# Patient Record
Sex: Female | Born: 1960 | Race: Black or African American | Hispanic: No | Smoking: Never smoker
Health system: Southern US, Community
[De-identification: ages and names within clinical notes are randomized; demographics above are authoritative.]

## PROBLEM LIST (undated history)

## (undated) DIAGNOSIS — E7800 Pure hypercholesterolemia, unspecified: Secondary | ICD-10-CM

## (undated) DIAGNOSIS — J309 Allergic rhinitis, unspecified: Secondary | ICD-10-CM

## (undated) DIAGNOSIS — E785 Hyperlipidemia, unspecified: Secondary | ICD-10-CM

## (undated) DIAGNOSIS — G4700 Insomnia, unspecified: Secondary | ICD-10-CM

## (undated) DIAGNOSIS — I10 Essential (primary) hypertension: Secondary | ICD-10-CM

## (undated) HISTORY — DX: Essential (primary) hypertension: I10

## (undated) HISTORY — DX: Pure hypercholesterolemia, unspecified: E78.00

## (undated) HISTORY — DX: Allergic rhinitis, unspecified: J30.9

## (undated) HISTORY — DX: Hyperlipidemia, unspecified: E78.5

## (undated) HISTORY — PX: OTHER SURGICAL HISTORY: SHX169

## (undated) HISTORY — DX: Insomnia, unspecified: G47.00

---

## 1998-06-05 ENCOUNTER — Other Ambulatory Visit: Admission: RE | Admit: 1998-06-05 | Discharge: 1998-06-05 | Payer: Self-pay | Admitting: Obstetrics and Gynecology

## 2001-04-17 ENCOUNTER — Other Ambulatory Visit: Admission: RE | Admit: 2001-04-17 | Discharge: 2001-04-17 | Payer: Self-pay | Admitting: *Deleted

## 2001-06-07 ENCOUNTER — Encounter (INDEPENDENT_AMBULATORY_CARE_PROVIDER_SITE_OTHER): Payer: Self-pay | Admitting: Specialist

## 2001-06-07 ENCOUNTER — Inpatient Hospital Stay (HOSPITAL_COMMUNITY): Admission: RE | Admit: 2001-06-07 | Discharge: 2001-06-10 | Payer: Self-pay | Admitting: *Deleted

## 2002-04-18 ENCOUNTER — Other Ambulatory Visit: Admission: RE | Admit: 2002-04-18 | Discharge: 2002-04-18 | Payer: Self-pay | Admitting: *Deleted

## 2008-03-16 ENCOUNTER — Inpatient Hospital Stay (HOSPITAL_COMMUNITY): Admission: RE | Admit: 2008-03-16 | Discharge: 2008-03-19 | Payer: Self-pay | Admitting: Internal Medicine

## 2008-03-16 ENCOUNTER — Ambulatory Visit: Payer: Self-pay | Admitting: Diagnostic Radiology

## 2008-03-16 ENCOUNTER — Encounter: Payer: Self-pay | Admitting: Emergency Medicine

## 2008-04-16 ENCOUNTER — Ambulatory Visit: Payer: Self-pay | Admitting: Internal Medicine

## 2008-06-14 ENCOUNTER — Ambulatory Visit (HOSPITAL_COMMUNITY): Admission: RE | Admit: 2008-06-14 | Discharge: 2008-06-14 | Payer: Self-pay | Admitting: Family Medicine

## 2010-02-06 IMAGING — CT CT CHEST WO/W CM
2 of 6 series · 15 of 36 positions shown, 18 images · IV contrast (agent unspecified)
Comparison: 03/16/2008

CLINICAL DATA: Pleurisy.  Possible sarcoidosis.

CT CHEST WITHOUT AND WITH CONTRAST
TECHNIQUE: Multidetector CT imaging of the chest was performed
following the standard protocol before and during bolus
administration of intravenous contrast.
Contrast: 100 ml [id number]

[Series 6: chest w/cm 5.0 st · axial · 0.64mm/px · z∈[+964,+1214]mm · 12 of 58 slices shown, 15 images]
[im 4/58  mediastinal]
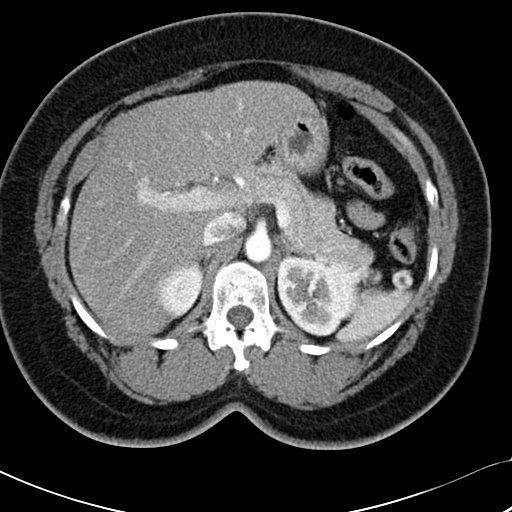
[im 4/58  lung]
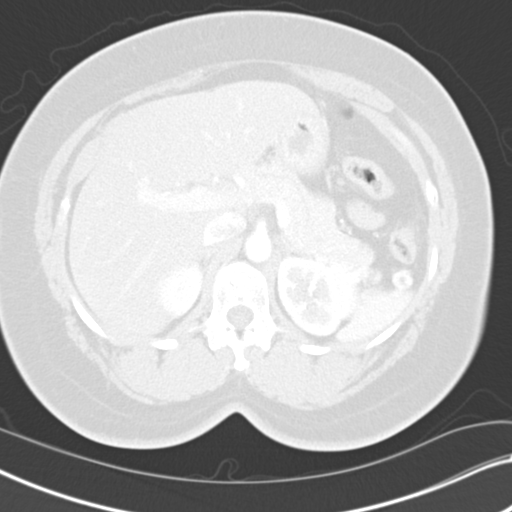
[im 8/58  lung]
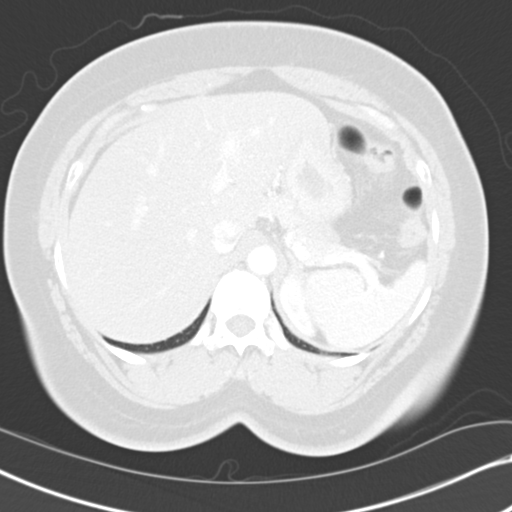
[im 12/58  lung]
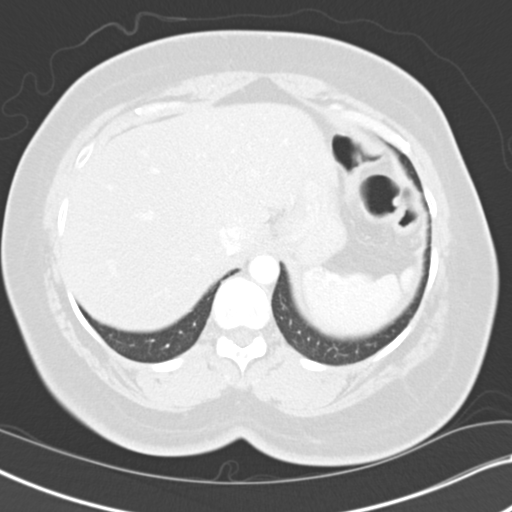
[im 20/58  lung]
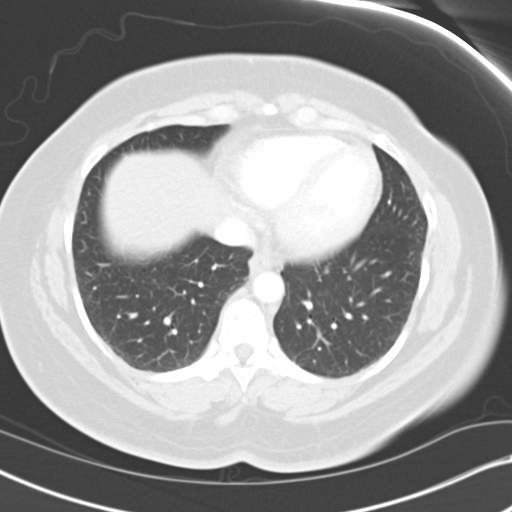
[im 23/58  mediastinal]
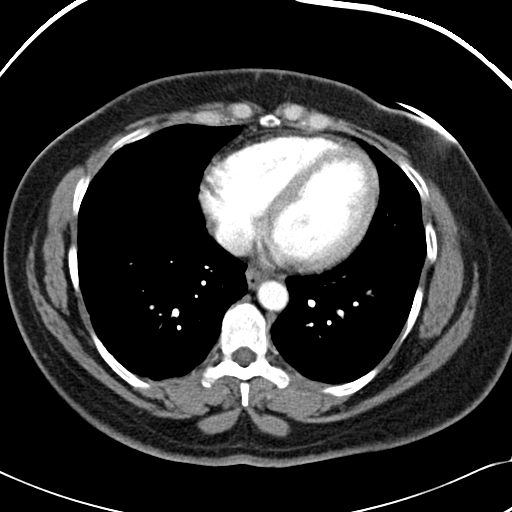
[im 23/58  lung]
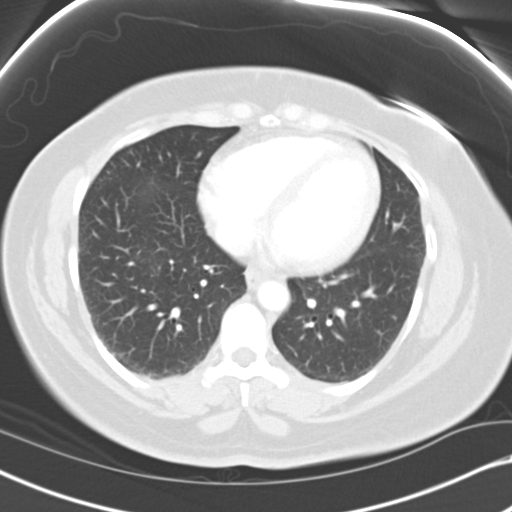
[im 27/58  lung]
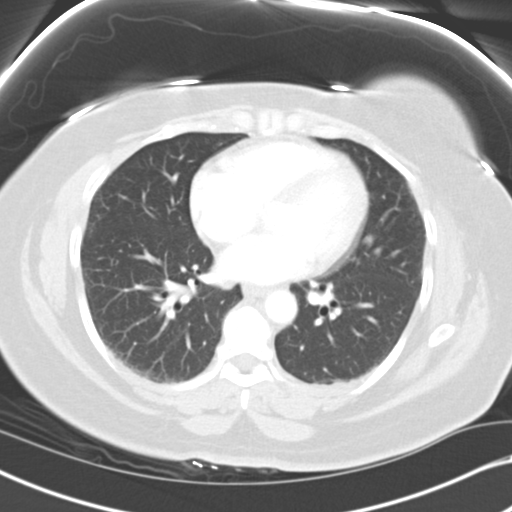
[im 31/58  lung]
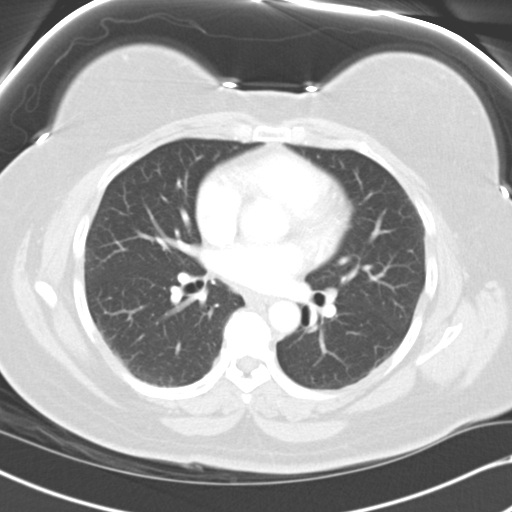
[im 35/58  lung]
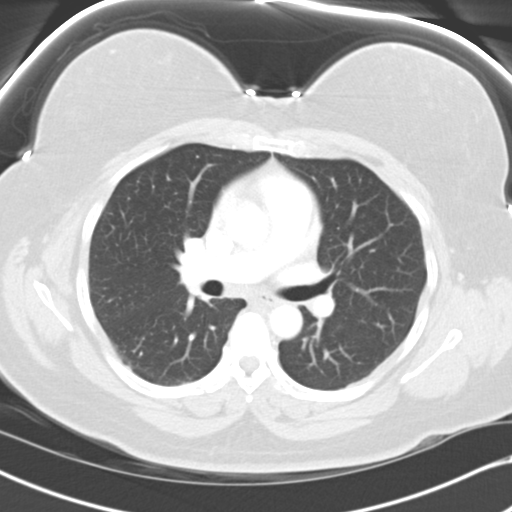
[im 39/58  mediastinal]
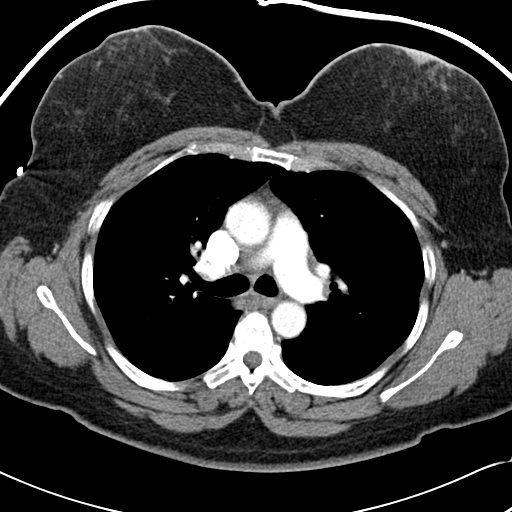
[im 39/58  lung]
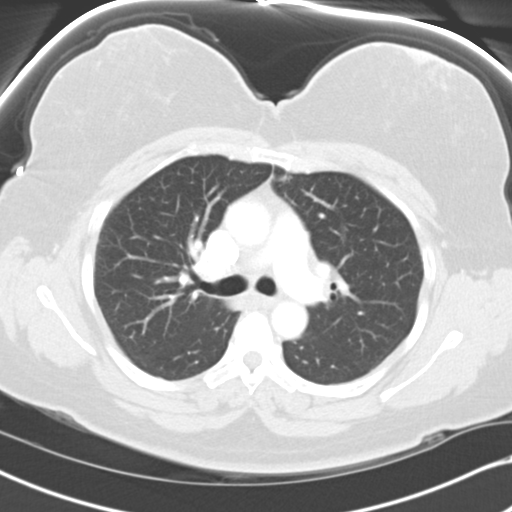
[im 46/58  lung]
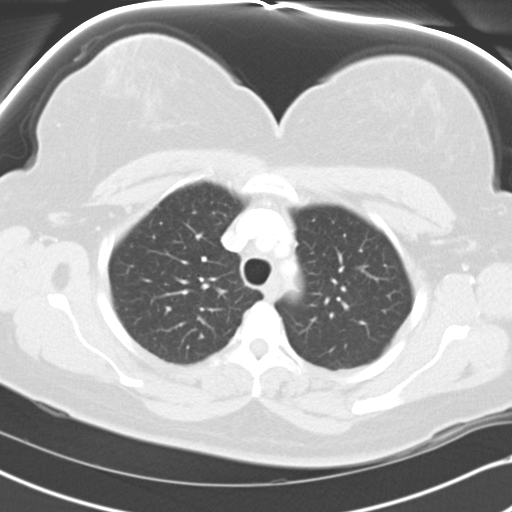
[im 50/58  lung]
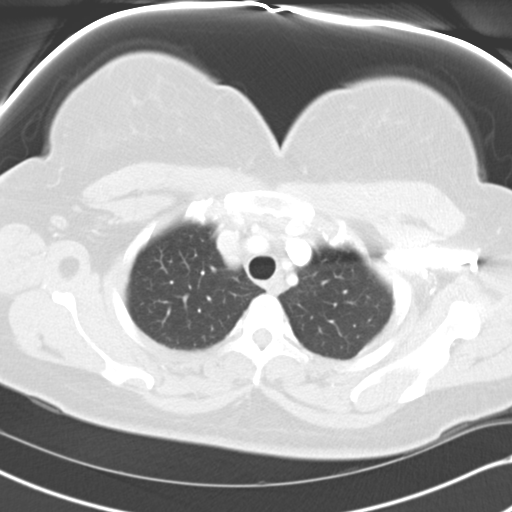
[im 54/58  lung]
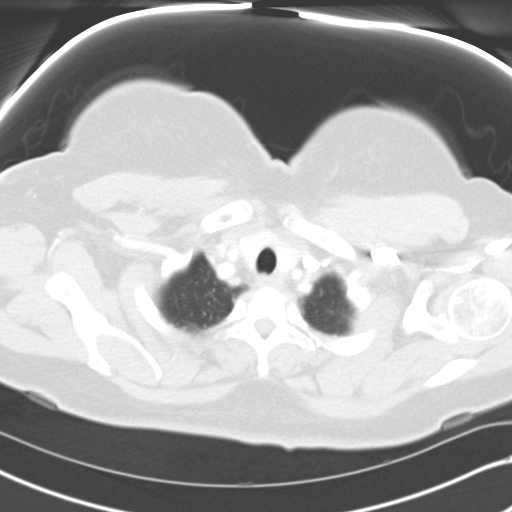

[Series 603: <mpr cor · coronal · 0.64mm/px · 3 of 91 slices shown]
[im 19/91  lung]
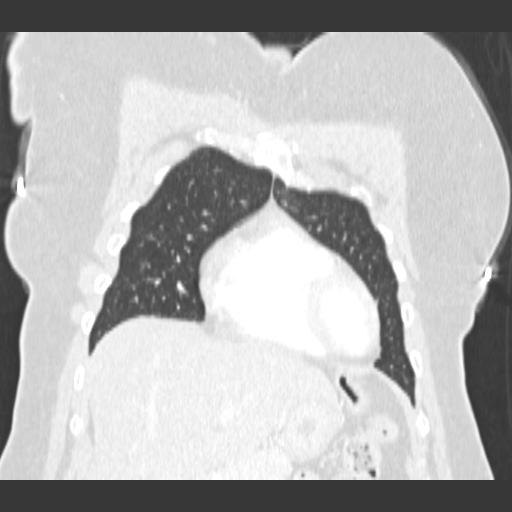
[im 37/91  lung]
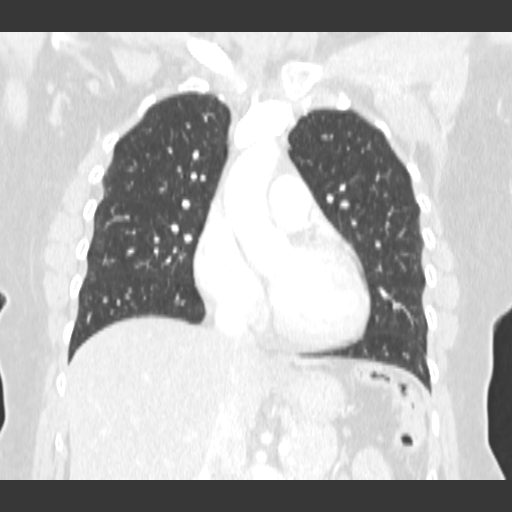
[im 55/91  lung]
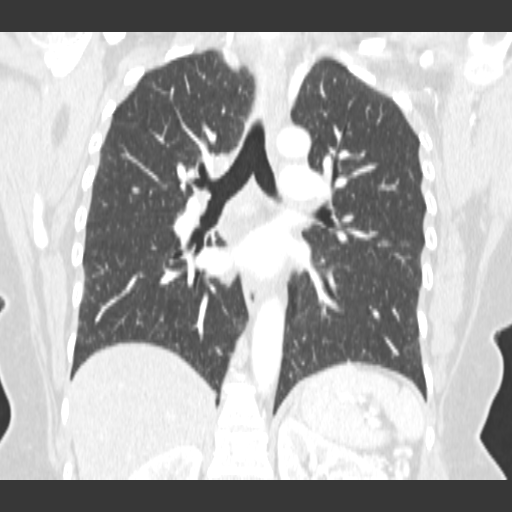

[15 of 36 positions shown; findings below may reference images not displayed]

FINDINGS: Previously seen hilar and mediastinal adenopathy has
resolved.

There is mild fatty infiltration of the visualized portion of the
liver.  Right subscapularis lipoma appears stable.  There is no
airway thickening or pleural nodularity.  However, very fine
subpleural reticular nodular interstitial prominence is again
noted.  This does not appear significantly progressive.

No pleural effusion noted.
IMPRESSION: 1.  Faint subpleural centrilobular nodularity raising the
possibility of hypersensitivity pneumonitis, respiratory
bronchiolitis (often seen in smokers), mild infectious airways
disease, or less likely vasculitis.
2.  The thoracic adenopathy has resolved and was likely reactive.

## 2010-04-02 ENCOUNTER — Other Ambulatory Visit: Payer: Self-pay | Admitting: Family Medicine

## 2010-04-02 ENCOUNTER — Other Ambulatory Visit (HOSPITAL_COMMUNITY)
Admission: RE | Admit: 2010-04-02 | Discharge: 2010-04-02 | Disposition: A | Discharge status: Home / Self Care | Payer: 59 | Source: Ambulatory Visit | Attending: Family Medicine | Admitting: Family Medicine

## 2010-04-02 DIAGNOSIS — Z0000 Encounter for general adult medical examination without abnormal findings: Secondary | ICD-10-CM | POA: Insufficient documentation

## 2010-06-15 LAB — COMPREHENSIVE METABOLIC PANEL
ALT: 38 U/L — ABNORMAL HIGH (ref 0–35)
AST: 40 U/L — ABNORMAL HIGH (ref 0–37)
Albumin: 4.5 g/dL (ref 3.5–5.2)
Alkaline Phosphatase: 55 U/L (ref 39–117)
Alkaline Phosphatase: 66 U/L (ref 39–117)
BUN: 10 mg/dL (ref 6–23)
BUN: 6 mg/dL (ref 6–23)
CO2: 28 mEq/L (ref 19–32)
CO2: 30 mEq/L (ref 19–32)
Calcium: 10.1 mg/dL (ref 8.4–10.5)
Calcium: 9.2 mg/dL (ref 8.4–10.5)
Chloride: 100 mEq/L (ref 96–112)
Creatinine, Ser: 0.7 mg/dL (ref 0.4–1.2)
GFR calc Af Amer: >60 mL/min (ref >60)
GFR calc non Af Amer: >60 mL/min (ref >60)
GFR calc non Af Amer: >60 mL/min (ref >60)
Glucose, Bld: 120 mg/dL — ABNORMAL HIGH (ref 70–99)
Glucose, Bld: 69 mg/dL — ABNORMAL LOW (ref 70–99)
Potassium: 3.6 mEq/L (ref 3.5–5.1)
Sodium: 139 mEq/L (ref 135–145)
Total Bilirubin: 0.6 mg/dL (ref 0.3–1.2)
Total Protein: 6.6 g/dL (ref 6.0–8.3)
Total Protein: 8.1 g/dL (ref 6.0–8.3)

## 2010-06-15 LAB — CBC
HCT: 39 % (ref 36.0–46.0)
HCT: 41.3 % (ref 36.0–46.0)
Hemoglobin: 13.3 g/dL (ref 12.0–15.0)
Hemoglobin: 14.1 g/dL (ref 12.0–15.0)
MCHC: 34 g/dL (ref 30.0–36.0)
MCHC: 34.2 g/dL (ref 30.0–36.0)
MCV: 90.9 fL (ref 78.0–100.0)
Platelets: 329 10*3/uL (ref 150–400)
RBC: 4.31 MIL/uL (ref 3.87–5.11)
RBC: 4.54 MIL/uL (ref 3.87–5.11)
RDW: 12 % (ref 11.5–15.5)
RDW: 12.7 % (ref 11.5–15.5)
WBC: 5.8 10*3/uL (ref 4.0–10.5)

## 2010-06-15 LAB — LIPASE, BLOOD: Lipase: 167 U/L (ref 23–300)

## 2010-06-15 LAB — URINALYSIS, ROUTINE W REFLEX MICROSCOPIC
Bilirubin Urine: NEGATIVE
Glucose, UA: NEGATIVE mg/dL
Hgb urine dipstick: NEGATIVE
Ketones, ur: NEGATIVE mg/dL
Nitrite: NEGATIVE
Protein, ur: NEGATIVE mg/dL
Specific Gravity, Urine: 1.021 (ref 1.005–1.030)
Urobilinogen, UA: 0.2 mg/dL (ref 0.0–1.0)
pH: 7 (ref 5.0–8.0)

## 2010-06-15 LAB — D-DIMER, QUANTITATIVE: D-Dimer, Quant: 0.72 ug/mL-FEU — ABNORMAL HIGH (ref 0.00–0.48)

## 2010-06-15 LAB — DIFFERENTIAL
Basophils Absolute: 0.2 10*3/uL — ABNORMAL HIGH (ref 0.0–0.1)
Basophils Relative: 4 % — ABNORMAL HIGH (ref 0–1)
Eosinophils Absolute: 0.2 10*3/uL (ref 0.0–0.7)
Eosinophils Relative: 3 % (ref 0–5)
Lymphocytes Relative: 38 % (ref 12–46)
Lymphs Abs: 2.2 10*3/uL (ref 0.7–4.0)
Monocytes Absolute: 0.6 10*3/uL (ref 0.1–1.0)
Monocytes Relative: 10 % (ref 3–12)
Neutro Abs: 2.6 10*3/uL (ref 1.7–7.7)
Neutrophils Relative %: 46 % (ref 43–77)

## 2010-06-15 LAB — PREGNANCY, URINE: Preg Test, Ur: NEGATIVE

## 2010-06-15 LAB — BASIC METABOLIC PANEL
BUN: 9 mg/dL (ref 6–23)
CO2: 27 mEq/L (ref 19–32)
Calcium: 9.1 mg/dL (ref 8.4–10.5)
Glucose, Bld: 85 mg/dL (ref 70–99)
Sodium: 141 mEq/L (ref 135–145)

## 2010-06-15 LAB — SEDIMENTATION RATE: Sed Rate: 25 mm/hr — ABNORMAL HIGH (ref 0–22)

## 2010-07-14 NOTE — H&P (Signed)
NAMEADDASYN, MCBREEN           ACCOUNT NO.:  [id number]   MEDICAL RECORD NO.:  [id number]          PATIENT TYPE:  INP   LOCATION:  3742                         FACILITY:  [facility]   PHYSICIAN:  Georgina Quint. Plotnikov, MDDATE OF BIRTH:  12/03/1960   DATE OF ADMISSION:  03/16/2008  DATE OF DISCHARGE:                              HISTORY & PHYSICAL   CHIEF COMPLAINT:  Right flank pain and shortness of breath.   HISTORY OF PRESENT ILLNESS:  The patient is a 50 year old female who  started to have sudden onset severe right flank pain on Saturday when  she was eating dinner.  The pain was unbearable for a while, she took  little Tylenol and it subsided.  Of note, she exercised prior doing  crunches and walking, but the pain does not feel to her like a pulled  muscle.  She was also sick with a cold for about a week prior.  The pain  has never subsided, it was much worse in the morning when she had to  hold her chest to get going.  Tylenol helped.  On Thursday, she traveled  to [place], on Wednesday came back.  While she was going to airport  with her carry-on, she developed severe pain in the right flank and  right chest at the bottom without radiation.  She was also short of  breath.  There was no nausea, vomiting, or diarrhea.  No lightheadedness  or syncope.  At the worse moment, the pain was 10/10.  She went to ER  today just to get checked due to the fact that she was very concerned  about the pain episode yesterday that was accompanied by dyspnea.  She  received medicine, now her pain is 0/10.  She denies leg pain, calf  swelling, and other symptoms.  No chills or fever.   PAST MEDICAL HISTORY:  Hypertension.   SURGICAL HISTORY:  Hysterectomy.   CURRENT MEDICATIONS:  Micardis daily unknown dose.   SOCIAL HISTORY:  She does not smoke or drink alcohol.  She works for  [employer].  She does not travel much.   ALLERGIES:  Morphine.   FAMILY HISTORY:  Father with coronary  artery disease.  No history of PE  in the family.  No clotting disorders.   REVIEW OF SYSTEMS:  As noted above, she had a cold prior.  The rest of  the 10-point review of systems is as above are negative.   PHYSICAL EXAMINATION:  VITAL SIGNS:  Temperature 98.4, blood pressure  121/93, heart rate 87, respirations 16, sats 98-99%.  GENERAL:  She is in no acute distress, slightly overweight.  HEENT:  Moist mucosa.  NECK:  Supple.  No thyromegaly or bruit.  LUNGS:  Clear.  No wheezes or rales.  HEART:  S1 and S2.  No gallop or no murmur.  CHEST:  Chest wall, nontender to palpation.  SKIN:  No rashes on the skin.  LS spine without deformities.  Palpation  of the chest wall, paraspinal muscles, and abdominal muscles are  unrevealing.  ABDOMEN:  Soft and nontender.  No organomegaly.  No mass felt.  EXTREMITIES:  Lower extremity is without edema.  Calves nontender.  Denna Haggard' sign is negative.  Pulse is symmetric and normal.  NEUROLOGIC:  Cranial nerves II through XII nonfocal.  Deep tendon  reflexes, muscle strength within normal limits.   LABORATORIES:  Urinalysis normal.  White count 5.8, hemoglobin 14.1, MCV  90.9, and platelets 329.  D-dimer 0.72, elevated.  Sodium 139, glucose  69, creatinine 0.7, potassium 3.6, and lipase 167.  Urine pregnancy test  is negative.  ALT 40, AST 40, and myoglobin, troponin, CK-MB all  negative.  Chest x-ray clear.  ABGs normal.  CT angiogram, no pulmonary  embolism with a limited evaluation of upper lobe; hilar adenopathy  present.  Fatty liver infiltration, otherwise unremarkable.   ASSESSMENT AND PLAN:  1. Right lower chest pain/right flank pain with an episode of dyspnea      on exertion.  The differential diagnosis is broad: her elevated D-      dimer  in view of her pain and shortness of breath makes one      suspicious for pulmonary embolism.  I doubt that a right upper lobe      pulmonary embolus would give this patient pain in the right flank.       Otherwise her CT was clear. Elev. D dimer is most likely a      nonspecific finding.  Most likely her pain is related to pleurisy      that developed following the upper respiratory tract infection.      Another possibility is a musculoskeletal strain (less likely).  We      will repeat D-dimer tomorrow.  She received dose of Lovenox in the      emergency room.  Of note, she is not short of breath now and her      pain is 0/10.  2. Adenopathy.  On chest x-ray, acute upper respiratory illness versus      sarcoid.  She will probably need to have  the CT scan repeated.  3. Hypertension.  On therapy.  4. Fatty liver infiltration, likely chronic.      Georgina Quint. Plotnikov, MD  Electronically Signed     AVP/MEDQ  D:  03/16/2008  T:  03/17/2008  Job:  [id number]   cc:   Dario Guardian, M.D.

## 2010-07-14 NOTE — Discharge Summary (Signed)
Gail Miles, Gail Miles NO.:  [id number]   MEDICAL RECORD NO.:  [id number]          PATIENT TYPE:  INP   LOCATION:  3742                         FACILITY:  [facility]   PHYSICIAN:  Ramiro Harvest, MD    DATE OF BIRTH:  05/02/1960   DATE OF ADMISSION:  03/16/2008  DATE OF DISCHARGE:  03/19/2008                               DISCHARGE SUMMARY   PRIMARY CARE PHYSICIAN:  Dr. Merri Miles of [facility].   DISCHARGE DIAGNOSES:  1. Pleurisy.  2. Hypertension.  3. Adenopathy.  4. Fatty liver infiltration per CT.  5. Allergies.   DISCHARGE MEDICATIONS:  1. Ibuprofen 600 mg p.o. t.i.d. x4 days.  2. Micardis hydrocortisone 20/12.5 mg p.o. daily.  3. Zyrtec 10 mg p.o. daily.  4. Phenergan 25 mg p.o. every 6 hours p.r.n. nausea.   DISPOSITION/FOLLOWUP:  The patient will be discharged home.  The patient  is to follow up with PCP in the next 1-2 weeks.  On follow up the  patient will need a basic metabolic profile checked to follow up on  electrolytes and renal function.  The patient will also need a repeat CT  in 3 months to follow up on this mediastinal and bilateral hilar  adenopathy.  If not improved, may consider workup for possible cycle  doses versus infectious etiology.   CONSULTATIONS DONE:  None.   PROCEDURES PERFORMED:  1. A CT angiogram of the chest was done on March 16, 2008, which      showed no evidence of pulmonary embolism, limited evaluation of the      upper lobes without evidence of low lung embolism, mediastinal and      bilateral hilar adenopathy.  This is nonspecific, could be      infectious or related to sarcoidosis.  Lymphoproliferative      processes felt much less likely, correlate with any signs, symptoms      of sarcoidosis.  Consider followup chest CT in approximately 3      months.  Subtle subpleural linear opacities in both lungs most      likely related to atelectasis, mild interstitial lung disease felt      less likely, fatty  infiltration of the liver.  2. Chest x-ray was obtained March 16, 2008, that showed no active      lung disease.  No scar or atelectasis the posterior right lobe.   BRIEF ADMISSION HISTORY AND PHYSICAL:  Ms. Gail Miles is a 50-  year-old female who started to have sudden onset severe right flank pain  on Saturday prior to admission when she was eating dinner.  This pain  was unbearable for a while.  She took a little Tylenol and it subsided.  Of note, she exercised prior to doing this with crutches and walking but  the pain did not feel to her like a pulled muscle.  The patient was also  sick with a cold for about a week prior to that.  The pain had never  subsided.  It was much worse in the morning when she had to hold her  chest to get going.  Tylenol  helped some.  On Thursday, she had traveled  to [place].  On Wednesday and had come back.  While she was going to  the airport with a carry-on she developed severe pain in her right flank  and right chest at the bottom without radiation.  She was also short of  breath.  There was no nausea, vomiting or diarrhea.  No lightheadedness  or syncope.  At the worst moment the pain was a 10 out of a 10.  The  patient presented to the ED on the day of admission just to get checked  due to the fact that she was very concerned about the pain episode the  day prior to admission which was also accompanied with dyspnea.  She  received medicine and the pain was now 0 out of 10.  She denied any neck  pain.  No calf swelling, no other symptoms.  No chills or fever.   PHYSICAL EXAM/ADMITTING PHYSICIAN:  Temperature 98.4.  Blood pressure  121/93.  Pulse of 87.  Respirations 60.  Satting 98%-99%.  GENERAL:  The patient no acute distress, slightly overweight.  HEENT:  Normocephalic, atraumatic.  Pupils equal, round and reactive to  light.  Extraocular movements intact.  Oropharynx is clear.  No lesions.  No exudates.  NECK:  Supple with no  lymphadenopathy.  Moist mucous membranes.  LUNGS:  Clear to auscultation bilaterally.  No wheezes, no crackles, no  rales.  CARDIOVASCULAR:  Regular rate rhythm.  No murmurs, rubs or gallops.  Chest wall was nontender to palpation.  SKIN:  No rashes on the skin at the L-spine without deformities.  Palpation of the chest wall paraspinal muscles and abdominal muscles  were unrevealing.  ABDOMEN:  Soft, nontender, no masses felt.  No organomegaly.  Positive  bowel sounds.  EXTREMITIES:  No clubbing, cyanosis or edema.  Calves were nontender.  Negative Homans' sign.  Symmetric pulses.  NEUROLOGICAL:  Cranial nerves II-XII were grossly intact.  No focal  deficits.  Deep tendon reflexes muscle strength within normal limits.   ADMISSION LABS:  UA was normal.  White count of 5.8, hemoglobin 14.1,  MCV of 90.9, platelets of 329, D-dimer of 0.72, sodium 139, glucose 69,  creatinine 0.7, potassium 3.6, lipase 167.  Urine pregnancy negative.  ALT 40, AST 40, myoglobin, troponin CK-MB of all negative.  Chest x-ray  was clear.  ABGs were normal.  CT angio showed no pulmonary embolism  with a limited evaluation of the upper lobe, hilar adenopathy was  present.  Fatty liver infiltration otherwise unremarkable.   HOSPITAL COURSE:  1. Pleurisy:  It was felt that the patient's the right lower chest      pain/right flank pain with an episode of dyspnea on exertion was      likely secondary to pleurisy.  A repeat  therapy D-dimer was      obtained which came back negative and was within normal limits.  A      chest CT as stated above did not show any evidence of pulmonary      embolism in the lower lobes.  A sed rate was obtained which came      back at 25.  Point of care cardiac markers were obtained which were      negative.  Lipase level was also obtained which was 167 within      normal limits.  The repeat D-dimer came back at 0.47.  The patient      was placed  on Motrin 600 mg 3 times daily with  improvement in her      symptoms.  The patient remained stable.  On hospital day #2 the      patient developed some nausea and emesis which was controlled      symptomatically.  The patient's nausea and emesis was controlled      symptomatically.  By day of discharge the patient's nausea and      emesis had resolved.  The patient's pleurisy had improved and the      patient will be discharged home in stable and improved condition.      The patient will be discharged home on ibuprofen 600 mg t.i.d. for      4 more days and to follow up with her PCP in the next 1-2 weeks.      The rest of the patient's chronic medical issues were stable      throughout the hospitalization and the patient will be discharged      in stable and improved condition.   Vital signs on day of discharge:  Temperature 98.2, blood pressure  119/73, pulse of 77, respiratory rate 18, satting 98% on room air.   DISCHARGE LABS:  Sodium 141, potassium 3.9, chloride 106, bicarb 27, BUN  9, creatinine 0.6, glucose of 85 and a calcium of 9.1.  It was a  pleasure taking care of Ms. Darcey Nora.      Ramiro Harvest, MD  Electronically Signed     DT/MEDQ  D:  03/19/2008  T:  03/19/2008  Job:  [id number]   cc:   Dario Guardian, M.D.

## 2010-07-17 NOTE — Op Note (Signed)
[facility]  Patient:    Gail Miles, Gail Miles Visit Number: [id number] MRN: [id number]          Service Type: GYN Location: 4W [id number] Attending Physician:  Marin Comment Dictated by:   Pershing Cox, M.D. Proc. Date: 06/07/01 Admit Date:  06/07/2001   CC:         Gail Miles. Gail Miles, M.D.   Operative Report  PREOPERATIVE DIAGNOSES:  1. Myomatous uterus.  2. Menorrhagia.  3. Anemia.  POSTOPERATIVE DIAGNOSES:  1. Myomatous uterus.  2. Menorrhagia.  3. Anemia.  PROCEDURE:  Exploratory laparotomy and supracervical hysterectomy.  ANESTHESIA:  General endotracheal.  SURGEON:  Pershing Cox, M.D.  ASSISTANT:  Gail Miles. Gail Miles, M.D.  INDICATIONS FOR PROCEDURE:  The patient is a 50 year old, gravida 3, para 2, African-American female status post tubal ligation. She has been having very heavy monthly menses and has six days which have produced extraordinarily heavy bleeding. When she was seen in my office approximately four months ago, she was found to have a very large uterus deviated to the left consistent with myomas. Hemoglobin at the time of her initial presentation was 6.3. She was subsequently treated with iron and Lupron Depot and is brought to the operating room today for hysterectomy.  OPERATIVE FINDINGS:  The uterus was approximately 14-16 weeks in size. There were three very large uterine myomas that were rising from the uterine fundus. One of them significantly distorted the blood supply on the patients right and this was approximately 10 cm in size. The cervix was short, there was evidence of tubal ligation in the past and two small peritubal cysts. The appendix was normal in appearance. There was no ascites and no evidence of implants.  DESCRIPTION OF PROCEDURE:  Gail Miles was brought to the operating room with an IV in place. In the holding area, she received a gram of Ancef and PAS stockings were placed on the  lower extremities. These were thigh high stockings. On the operating room table, general endotracheal anesthesia was administered without difficulty. She was placed in the frog leg position and prepped with Hibiclens to cleanse the anterior abdominal wall, perineum and vagina. A Foley catheter was sterilely inserted into the bladder. On exam under anesthesia, I felt that the fibroids were diminished enough that we would be able to do a transverse incision. Measuring above the symphysis approximately two finger breadths, a 0.25% Marcaine was injected for a length of approximately 20 cm in each direction. The scalpel was then used to incise the skin, cautery was used to take [place] down through the subcutaneous tissues until the fascia was encountered. The fascia was scored in the midline and then the incision was extended in smile-like fashion superiorly on each side. The fascia was separated from the underlying rectus muscles by blunt and sharp dissection. Cautery was used where it was most adherent. The rectus muscles were separated in the midline. The fascia was tinted and opened atraumatically without injury to underlying structures or the bladder. We separated the lower fascial edges from one another with sharp dissection. In palpating the uterus, it was clear that there were no lower uterine segment myomas. The fascial and skin incisions were extended approximately 2 more centimeters on either side to prepare for removal of the uterus.  The Balfour self retaining retractor was used to retract the abdominal wall using towels to cover the edges of the skin and elevate the retractor off the underlying vessels. The round ligaments were identified  and suture ligated and held for later in the procedure. The utero-ovarian ligament and mesosalpinx of the fallopian tube were identified and then pierced through the broad ligament and separated from the uterus. Each of these pedicles was clamped,  sutured, and free tie ligated. A cork screw was placed into the central fundal uterine myoma and used to elevate the fundus. We were then able to lift the uterus out of the cavity. It was still impossible to see the uterine arteries and therefore a myomectomy was performed of the largest myoma on the right. Dilute vasopressin was injected into the surface of the myoma. Cautery was used to incise, the myoma was separated from the overlying serosa by blunt and sharp dissection and the myoma was grasped with the towel clip and removed totally from the uterine specimen. Once it had been removed, it was possible to see the uterine arteries on each side.  The peritoneum overlying the lower uterine segment was incised and the bladder was taken down off the lower uterine segment. The uterine arteries were clamped on each side of the specimen, cut, suture and free tie ligated. On the patients left, it was necessary to do this in two steps because the paracervical tissue was quite wide. In each situation, we took care to visualize and avoid the ureter. The second clamps were placed along the uterine arteries so that the uterine artery would be doubly ligated. The malleable retractor was then placed posterior to the uterus and a knife was used to excise the uterine specimen. Approximately 2 cm of cervix were left. The cervix was then oversewn with #0 Vicryl sutures while being held with clamps. On the patients left, there was bleeding from the uterosacral ligaments. This was suture ligated but was not cut. Several other sutures were placed just to the lateral margins of the cervix to bring the serosal edges together and to contain small bleeders. We looked at the bladder peritoneum and inspected it for bleeding. There was none. The pelvic cavity was irrigated and made hemostatic were there were small bleeders. There were several small bleeders on the rectus fascia and these were cauterized. One of  them on the  patients right was suture ligated. The fallopian tubes and ovaries were visualized and sutured to the round ligaments. On each of the fallopian tubes, there were small peritubal cysts which were excised but not sent for specimens. Once we had inspected the pelvis and found that there was no bleeding, we then layered the rectosigmoid into the cul-de-sac, small bowel was drawn over the top and adhesions of the omentum to the anterior abdominal wall were taken down. The fascia was closed with running #0 monocryl suture starting on each edge and burying the stitch in the middle. The subcutaneous tissues were made hemostatic, skin edges were brought together with 4-0 monocryl using a running subcuticular stitch. Benzoin and Steri-Strips were applied.  Estimated blood loss 200 cc. Urine output 100 cc. Fluids 2700 cc. Complications none. Specimens, uterine fundus with enucleated myoma. Dictated by:   Pershing Cox, M.D. Attending Physician:  Marin Comment DD:  06/07/01 TD:  06/08/01 Job: [id number]

## 2010-07-17 NOTE — Discharge Summary (Signed)
[facility]  Patient:    Gail Miles, Gail Miles Visit Number: [id number] MRN: [id number]          Service Type: Attending:  Pershing Cox, M.D. Dictated by:   Pershing Cox, M.D. Adm. Date:  06/07/01 Disc. Date: 06/10/01   CC:         Dario Guardian, M.D.   Discharge Summary  ADMITTING DIAGNOSES: 1. Myomatous uterus. 2. Menorrhagia. 3. Anemia.  DISCHARGE DIAGNOSES: 1. Menorrhagia. 2. Myomatous uterus. 3. Anemia. 4. Low-grade temperature.  HISTORY OF PRESENT ILLNESS:  For details of the patients History and Physical, please see the note dated June 07, 2001.  The patient is a 50 year old, African-American female who has been known to have a myomatous uterus for several years.  Her menstrual periods have been very heavy and when she presented, her hemoglobin was 6.3.  She was treated with Depo-Lupron. After resolving her hemoglobin and anemia, she was admitted for hysterectomy. On the day of admission, she was taken to the operating room where supracervical hysterectomy was performed without difficulty.  HOSPITAL COURSE:  Since admission, the patient has had improvement in symptomatology and has been able to resume most of her normal functions.  She was treated with PCA morphine, but had significant nausea on this medication and was switched to IV Dilaudid by PCA.  This was discontinued on postoperative day #2 and she has been on pain medications since that time. She began a regular diet yesterday and has tolerated it well.  She ate a full diet today without any nausea.  Over the last 24 hours, she has developed low-grade temperature with the highest value being 101.5.  At this time, her temperature is 99.3.  Her examination shows clear lungs with good use of the spirometer.  There is no CVA tenderness.  Her abdomen is soft and her wound is intact.  There are Steri-Strips covering the subcuticular closure.  Her calves are nontender.   Her white blood cell count is 5.1, hemoglobin stable at 10.8 and platelets are 282.  The patient has been on hydrochlorothiazide for hypertension.  In the hospital course, this has been held because her hypertension has been absent and in fact, she has been significantly hypotensive.  She is given discharge instructions today to hold her hydrochlorothiazide until she sees Dr. Merri Brunette.  The patients low-grade temperature is uncertain etiology.  She is given prescription for Darvocet to be used at home for pain.  She will take her temperature every four hours and certainly take it before each dose of Darvocet.  She will let us know if her temperature stays over 101.5 x2 or if she has any chills.  Of note, her urine is concentrated.  Her urinalysis is negative.  She will be pushing p.o. fluids.  She understands that her potassium is 3.4.  She drinks a lot of orange juice and takes a daily banana and she will resume this at home.  The patient pathology is pending at the time of this dictation.  She had several fibroids and the uterine fundus was removed, but there is no evidence of this information being available at the time of discharge. Dictated by:   Pershing Cox, M.D. Attending:  Pershing Cox, M.D. DD:  06/10/01 TD:  06/12/01 Job: [id number]

## 2013-03-12 ENCOUNTER — Other Ambulatory Visit (HOSPITAL_COMMUNITY)
Admission: RE | Admit: 2013-03-12 | Discharge: 2013-03-12 | Disposition: A | Discharge status: Home / Self Care | Payer: 59 | Source: Ambulatory Visit | Attending: Family Medicine | Admitting: Family Medicine

## 2013-03-12 ENCOUNTER — Other Ambulatory Visit: Payer: Self-pay | Admitting: Family Medicine

## 2013-03-12 DIAGNOSIS — Z124 Encounter for screening for malignant neoplasm of cervix: Secondary | ICD-10-CM | POA: Insufficient documentation

## 2013-03-27 ENCOUNTER — Encounter: Payer: Self-pay | Admitting: Cardiology

## 2013-03-27 ENCOUNTER — Encounter: Payer: Self-pay | Admitting: *Deleted

## 2013-03-27 DIAGNOSIS — J309 Allergic rhinitis, unspecified: Secondary | ICD-10-CM | POA: Insufficient documentation

## 2013-03-27 DIAGNOSIS — I10 Essential (primary) hypertension: Secondary | ICD-10-CM | POA: Insufficient documentation

## 2013-03-27 DIAGNOSIS — E785 Hyperlipidemia, unspecified: Secondary | ICD-10-CM | POA: Insufficient documentation

## 2013-03-27 DIAGNOSIS — E7800 Pure hypercholesterolemia, unspecified: Secondary | ICD-10-CM | POA: Insufficient documentation

## 2013-03-27 DIAGNOSIS — G4700 Insomnia, unspecified: Secondary | ICD-10-CM | POA: Insufficient documentation

## 2013-04-06 ENCOUNTER — Encounter: Payer: Self-pay | Admitting: Cardiology

## 2013-04-06 ENCOUNTER — Ambulatory Visit (INDEPENDENT_AMBULATORY_CARE_PROVIDER_SITE_OTHER): Payer: 59 | Admitting: Cardiology

## 2013-04-06 VITALS — BP 130/80 | HR 83 | Ht 63.0 in | Wt 178.0 lb

## 2013-04-06 DIAGNOSIS — E785 Hyperlipidemia, unspecified: Secondary | ICD-10-CM

## 2013-04-06 DIAGNOSIS — I10 Essential (primary) hypertension: Secondary | ICD-10-CM

## 2013-04-06 DIAGNOSIS — R079 Chest pain, unspecified: Secondary | ICD-10-CM

## 2013-04-06 DIAGNOSIS — R9431 Abnormal electrocardiogram [ECG] [EKG]: Secondary | ICD-10-CM

## 2013-04-06 DIAGNOSIS — Z8249 Family history of ischemic heart disease and other diseases of the circulatory system: Secondary | ICD-10-CM

## 2013-04-06 NOTE — Progress Notes (Signed)
[address], [address] [place], [place]  [address] Phone: [phone] Fax:  [phone]  Date:  04/06/2013   ID:  Gail Miles, DOB 12-Jan-1961, MRN [id number]  PCP:  Allean Found, MD   History of Present Illness: Gail Miles is a 53 y.o. female here with hypertension, hyperlipidemia Candace Smith secondary to abnormal EKG performed at her clinic. She likes to one half marathons and there is heart disease and her family. Hemoglobin 13.6, creatinine 0.75, potassium 3.4, LDL cholesterol 98 on 20 mg of Crestor a day. HDL is 51. No chest pain, no SOB, no syncope, no palpitations. Father had CABG at age 25. HTN since 25 years old.  She is concerned because she does run half marathons. EKG today shows septal infarct pattern, nonspecific ST flattening in inferior lead aVF.   Wt Readings from Last 3 Encounters:  04/06/13 178 lb (80.74 kg)     Past Medical History  Diagnosis Date  . Hypercholesteremia   . HTN (hypertension)   . Allergic rhinitis   . Insomnia   . Hyperlipoproteinemia     Past Surgical History  Procedure Laterality Date  . Pleurisy age 34      Current Outpatient Prescriptions  Medication Sig Dispense Refill  . Cetirizine HCl (ZYRTEC PO) Take 1 tablet by mouth daily.      . Multiple Vitamin (MULTIVITAMIN) capsule Take 2 tabs daily      . Omega-3 Fatty Acids (OMEGA-3 FISH OIL PO) Take by mouth. Take 1 tab daily      . rosuvastatin (CRESTOR) 20 MG tablet Take 20 mg by mouth daily.      Marland Kitchen telmisartan-hydrochlorothiazide (MICARDIS HCT) 80-25 MG per tablet Take 1 tablet by mouth daily.       No current facility-administered medications for this visit.    Allergies:    Allergies  Allergen Reactions  . Morphine And Related     Social History:  The patient  reports that she has never smoked. She does not have any smokeless tobacco history on file.   Family History  Problem Relation Age of Onset  . CAD Father   . Heart attack Father     . Hyperlipidemia Father   . Hypertension Mother     ROS:  Please see the history of present illness.    denies any syncope, bleeding, orthopnea, PND, chest pain, shortness of breath, palpitations, rash.   All other systems reviewed and negative.   PHYSICAL EXAM: VS:  BP 130/80  Pulse 83  Ht 5\' 3"  (1.6 m)  Wt 178 lb (80.74 kg)  BMI 31.54 kg/m2 Well nourished, well developed, in no acute distress HEENT: normal, [place]/AT, EOMI Neck: no JVD, normal carotid upstroke, no bruit Cardiac:  normal S1, S2; RRR; no murmur Lungs:  clear to auscultation bilaterally, no wheezing, rhonchi or rales Abd: soft, nontender, no hepatomegaly, no bruits Ext: no edema, 2+ distal pulses Skin: warm and dry GU: deferred Neuro: no focal abnormalities noted, AAO x 3  EKG: 04/06/13 - EKG today shows septal infarct pattern, nonspecific ST flattening in inferior lead aVF.      ASSESSMENT AND PLAN:  1. Abnormal EKG-septal infarct pattern possibly normal variant for her however given her strenuous exercise, familial hyperlipidemia, hypertension, family history of CAD, I would like to proceed with exercise treadmill test to ensure that there no gross evidence or gross signs of ischemia.  2. Family history of CAD-excellent primary prevention. Continue. 3. Hypertension-excellent control on current  medications. She has been hypertensive since age 53. 4. Hyperlipidemia-likely familial hyperlipidemia. Currently on high-dose Crestor. Excellent LDL less than 100 recently.  Signed, Donato SchultzMark Patrice Moates, MD [facility]  04/06/2013 8:41 AM

## 2013-04-06 NOTE — Patient Instructions (Signed)
Your physician recommends that you continue on your current medications as directed. Please refer to the Current Medication list given to you today.  Your physician has requested that you have an exercise tolerance test. For further information please visit [email or web address]. Please also follow instruction sheet, as given.  Follow up as needed  

## 2013-04-10 ENCOUNTER — Encounter: Payer: 59 | Admitting: Nurse Practitioner

## 2013-04-16 ENCOUNTER — Encounter: Payer: 59 | Admitting: Physician Assistant

## 2015-05-12 ENCOUNTER — Ambulatory Visit: Payer: Self-pay | Admitting: Podiatry

## 2016-08-09 DIAGNOSIS — I10 Essential (primary) hypertension: Secondary | ICD-10-CM | POA: Diagnosis not present

## 2016-08-09 DIAGNOSIS — E7800 Pure hypercholesterolemia, unspecified: Secondary | ICD-10-CM | POA: Diagnosis not present

## 2016-08-09 DIAGNOSIS — K219 Gastro-esophageal reflux disease without esophagitis: Secondary | ICD-10-CM | POA: Diagnosis not present

## 2016-09-13 DIAGNOSIS — Z91018 Allergy to other foods: Secondary | ICD-10-CM | POA: Diagnosis not present

## 2016-09-13 DIAGNOSIS — Z79899 Other long term (current) drug therapy: Secondary | ICD-10-CM | POA: Diagnosis not present

## 2016-09-13 DIAGNOSIS — J309 Allergic rhinitis, unspecified: Secondary | ICD-10-CM | POA: Diagnosis not present

## 2016-09-13 DIAGNOSIS — Z803 Family history of malignant neoplasm of breast: Secondary | ICD-10-CM | POA: Diagnosis not present

## 2016-09-13 DIAGNOSIS — Z800 Family history of malignant neoplasm of digestive organs: Secondary | ICD-10-CM | POA: Diagnosis not present

## 2016-09-13 DIAGNOSIS — E559 Vitamin D deficiency, unspecified: Secondary | ICD-10-CM | POA: Diagnosis not present

## 2016-09-13 DIAGNOSIS — J305 Allergic rhinitis due to food: Secondary | ICD-10-CM | POA: Diagnosis not present

## 2016-09-13 DIAGNOSIS — I10 Essential (primary) hypertension: Secondary | ICD-10-CM | POA: Diagnosis not present

## 2016-09-13 DIAGNOSIS — R5383 Other fatigue: Secondary | ICD-10-CM | POA: Diagnosis not present

## 2016-09-15 DIAGNOSIS — K589 Irritable bowel syndrome without diarrhea: Secondary | ICD-10-CM | POA: Diagnosis not present

## 2016-09-15 DIAGNOSIS — Z91018 Allergy to other foods: Secondary | ICD-10-CM | POA: Diagnosis not present

## 2016-09-15 DIAGNOSIS — T7840XA Allergy, unspecified, initial encounter: Secondary | ICD-10-CM | POA: Diagnosis not present

## 2016-09-23 ENCOUNTER — Ambulatory Visit (INDEPENDENT_AMBULATORY_CARE_PROVIDER_SITE_OTHER): Payer: 59

## 2016-09-23 ENCOUNTER — Ambulatory Visit: Payer: 59

## 2016-09-23 ENCOUNTER — Ambulatory Visit (INDEPENDENT_AMBULATORY_CARE_PROVIDER_SITE_OTHER): Payer: 59 | Admitting: Podiatry

## 2016-09-23 ENCOUNTER — Encounter: Payer: Self-pay | Admitting: Podiatry

## 2016-09-23 DIAGNOSIS — M722 Plantar fascial fibromatosis: Secondary | ICD-10-CM

## 2016-09-23 MED ORDER — MELOXICAM 15 MG PO TABS: 15.0000 mg | ORAL_TABLET | Freq: Every day | ORAL | 3 refills | Status: AC

## 2016-09-23 MED ORDER — METHYLPREDNISOLONE 4 MG PO TBPK: ORAL_TABLET | ORAL | 0 refills | Status: AC

## 2016-09-23 NOTE — Patient Instructions (Addendum)
Heel Spur A heel spur is a bony growth that forms on the bottom of your heel bone (calcaneus). Heel spurs are common and do not always cause pain. However, heel spurs often cause inflammation in the strong band of tissue that runs underneath the bone of your foot (plantar fascia). When this happens, you may feel pain on the bottom of your foot, near your heel. What are the causes? The cause of heel spurs is not completely understood. They may be caused by pressure on the heel. Or, they may stem from the muscle attachments (tendons) near the spur pulling on the heel. What increases the risk? You may be at risk for a heel spur if you:  Are older than 40.  Are overweight.  Have wear and tear arthritis (osteoarthritis).  Have plantar fascia inflammation.  What are the signs or symptoms? Some people have heel spurs but no symptoms. If you do have symptoms, they may include:  Pain in the bottom of your heel.  Pain that is worse when you first get out of bed.  Pain that gets worse after walking or standing.  How is this diagnosed? Your health care provider may diagnose a heel spur based on your symptoms and a physical exam. You may also have an X-ray of your foot to check for a bony growth coming from the calcaneus. How is this treated? Treatment aims to relieve the pain from the heel spur. This may include:  Stretching exercises.  Losing weight.  Wearing specific shoes, inserts, or orthotics for comfort and support.  Wearing splints at night to properly position your feet.  Taking over-the-counter medicine to relieve pain.  Being treated with high-intensity sound waves to break up the heel spur (extracorporeal shock wave therapy).  Getting steroid injections in your heel to reduce swelling and ease pain.  Having surgery if your heel spur causes long-term (chronic) pain.  Follow these instructions at home:  Take medicines only as directed by your health care provider.  Ask  your health care provider if you should use ice or cold packs on the painful areas of your heel or foot.  Avoid activities that cause you pain until you recover or as directed by your health care provider.  Stretch before exercising or being physically active.  Wear supportive shoes that fit well as directed by your health care provider. You might need to buy new shoes. Wearing old shoes or shoes that do not fit correctly may not provide the support that you need.  Lose weight if your health care provider thinks you should. This can relieve pressure on your foot that may be causing pain and discomfort. Contact a health care provider if:  Your pain continues or gets worse. This information is not intended to replace advice given to you by your health care provider. Make sure you discuss any questions you have with your health care provider. Document Released: 03/24/2005 Document Revised: 07/24/2015 Document Reviewed: 04/18/2013 Elsevier Interactive Patient Education  2018 [employer] Plantar Fasciitis Rehab Ask your health care provider which exercises are safe for you. Do exercises exactly as told by your health care provider and adjust them as directed. It is normal to feel mild stretching, pulling, tightness, or discomfort as you do these exercises, but you should stop right away if you feel sudden pain or your pain gets worse. Do not begin these exercises until told by your health care provider. Stretching and range of motion exercises These exercises warm up your muscles  and joints and improve the movement and flexibility of your foot. These exercises also help to relieve pain. Exercise A: Plantar fascia stretch  1. Sit with your left / right leg crossed over your opposite knee. 2. Hold your heel with one hand with that thumb near your arch. With your other hand, hold your toes and gently pull them back toward the top of your foot. You should feel a stretch on the bottom of your toes or  your foot or both. 3. Hold this stretch for__________ seconds. 4. Slowly release your toes and return to the starting position. Repeat __________ times. Complete this exercise __________ times a day. Exercise B: Gastroc, standing  1. Stand with your hands against a wall. 2. Extend your left / right leg behind you, and bend your front knee slightly. 3. Keeping your heels on the floor and keeping your back knee straight, shift your weight toward the wall without arching your back. You should feel a gentle stretch in your left / right calf. 4. Hold this position for __________ seconds. Repeat __________ times. Complete this exercise __________ times a day. Exercise C: Soleus, standing 1. Stand with your hands against a wall. 2. Extend your left / right leg behind you, and bend your front knee slightly. 3. Keeping your heels on the floor, bend your back knee and slightly shift your weight over the back leg. You should feel a gentle stretch deep in your calf. 4. Hold this position for __________ seconds. Repeat __________ times. Complete this exercise __________ times a day. Exercise D: Gastrocsoleus, standing 1. Stand with the ball of your left / right foot on a step. The ball of your foot is on the walking surface, right under your toes. 2. Keep your other foot firmly on the same step. 3. Hold onto the wall or a railing for balance. 4. Slowly lift your other foot, allowing your body weight to press your heel down over the edge of the step. You should feel a stretch in your left / right calf. 5. Hold this position for __________ seconds. 6. Return both feet to the step. 7. Repeat this exercise with a slight bend in your left / right knee. Repeat __________ times with your left / right knee straight and __________ times with your left / right knee bent. Complete this exercise __________ times a day. Balance exercise This exercise builds your balance and strength control of your arch to help  take pressure off your plantar fascia. Exercise E: Single leg stand 1. Without shoes, stand near a railing or in a doorway. You may hold onto the railing or door frame as needed. 2. Stand on your left / right foot. Keep your big toe down on the floor and try to keep your arch lifted. Do not let your foot roll inward. 3. Hold this position for __________ seconds. 4. If this exercise is too easy, you can try it with your eyes closed or while standing on a pillow. Repeat __________ times. Complete this exercise __________ times a day. This information is not intended to replace advice given to you by your health care provider. Make sure you discuss any questions you have with your health care provider. Document Released: 02/15/2005 Document Revised: 10/21/2015 Document Reviewed: 12/30/2014 Elsevier Interactive Patient Education  2018 [employer]

## 2016-09-24 NOTE — Progress Notes (Signed)
She presents today as a new patient with a chief complaint of pain to the bottom left of her heel 3 months. Denies any trauma.  Objective: Vital signs are stable alert and oriented 3. Pulses are palpable. Neurologic sensorium is intact. Deep tendon reflexes are intact muscle strength is normal bilateral. Orthopedic evaluation of his result was distal to the ankle range of motion without crepitation. Cutaneous evaluation shows supple well-hydrated cutis. Radiographs taken today do demonstrate soft tissue increase in density of the plantar fascia calcaneal insertion site of the left heel. She does have pain on palpation medial calcaneal tubercle of the left hip.  Assessment: Plantar fasciitis left foot.  Plan: Injected the left heel today with Kenalog and local anesthetic we dispensed a plantar fascia brace. She already has a night splint at home. Provided her with a prescription for Medrol Dosepak to be followed by meloxicam. Discussed appropriate shoe gear stretching exercises ice therapy and sugar modifications. I will follow-up with her in 1 month. Should she have questions or concerns she will notify us immediately.  Gail Miles DPM

## 2016-09-27 DIAGNOSIS — R5383 Other fatigue: Secondary | ICD-10-CM | POA: Diagnosis not present

## 2016-10-21 ENCOUNTER — Ambulatory Visit: Payer: 59 | Admitting: Podiatry

## 2016-11-23 ENCOUNTER — Other Ambulatory Visit: Payer: Self-pay | Admitting: Family Medicine

## 2016-11-23 ENCOUNTER — Other Ambulatory Visit (HOSPITAL_COMMUNITY)
Admission: RE | Admit: 2016-11-23 | Discharge: 2016-11-23 | Disposition: A | Discharge status: Home / Self Care | Payer: 59 | Source: Ambulatory Visit | Attending: Family Medicine | Admitting: Family Medicine

## 2016-11-23 DIAGNOSIS — Z0000 Encounter for general adult medical examination without abnormal findings: Secondary | ICD-10-CM | POA: Insufficient documentation

## 2016-11-23 DIAGNOSIS — E7800 Pure hypercholesterolemia, unspecified: Secondary | ICD-10-CM | POA: Diagnosis not present

## 2016-11-24 LAB — CYTOLOGY - PAP
Diagnosis: NEGATIVE
HPV: NOT DETECTED

## 2016-12-30 DIAGNOSIS — Z1231 Encounter for screening mammogram for malignant neoplasm of breast: Secondary | ICD-10-CM | POA: Diagnosis not present

## 2017-01-15 DIAGNOSIS — L03211 Cellulitis of face: Secondary | ICD-10-CM | POA: Diagnosis not present

## 2017-01-28 DIAGNOSIS — L03211 Cellulitis of face: Secondary | ICD-10-CM | POA: Diagnosis not present

## 2017-08-17 DIAGNOSIS — E7800 Pure hypercholesterolemia, unspecified: Secondary | ICD-10-CM | POA: Diagnosis not present

## 2017-08-17 DIAGNOSIS — J309 Allergic rhinitis, unspecified: Secondary | ICD-10-CM | POA: Diagnosis not present

## 2017-08-17 DIAGNOSIS — I10 Essential (primary) hypertension: Secondary | ICD-10-CM | POA: Diagnosis not present

## 2017-11-28 DIAGNOSIS — M9902 Segmental and somatic dysfunction of thoracic region: Secondary | ICD-10-CM | POA: Diagnosis not present

## 2017-11-28 DIAGNOSIS — M9908 Segmental and somatic dysfunction of rib cage: Secondary | ICD-10-CM | POA: Diagnosis not present

## 2017-11-28 DIAGNOSIS — M5414 Radiculopathy, thoracic region: Secondary | ICD-10-CM | POA: Diagnosis not present

## 2017-11-29 DIAGNOSIS — M9902 Segmental and somatic dysfunction of thoracic region: Secondary | ICD-10-CM | POA: Diagnosis not present

## 2017-11-29 DIAGNOSIS — M9908 Segmental and somatic dysfunction of rib cage: Secondary | ICD-10-CM | POA: Diagnosis not present

## 2017-11-29 DIAGNOSIS — M5414 Radiculopathy, thoracic region: Secondary | ICD-10-CM | POA: Diagnosis not present

## 2017-12-02 DIAGNOSIS — M9902 Segmental and somatic dysfunction of thoracic region: Secondary | ICD-10-CM | POA: Diagnosis not present

## 2017-12-02 DIAGNOSIS — M5414 Radiculopathy, thoracic region: Secondary | ICD-10-CM | POA: Diagnosis not present

## 2017-12-02 DIAGNOSIS — M9908 Segmental and somatic dysfunction of rib cage: Secondary | ICD-10-CM | POA: Diagnosis not present

## 2017-12-20 DIAGNOSIS — Z1231 Encounter for screening mammogram for malignant neoplasm of breast: Secondary | ICD-10-CM | POA: Diagnosis not present

## 2018-02-14 DIAGNOSIS — E7800 Pure hypercholesterolemia, unspecified: Secondary | ICD-10-CM | POA: Diagnosis not present

## 2018-02-14 DIAGNOSIS — J309 Allergic rhinitis, unspecified: Secondary | ICD-10-CM | POA: Diagnosis not present

## 2018-02-14 DIAGNOSIS — Z0000 Encounter for general adult medical examination without abnormal findings: Secondary | ICD-10-CM | POA: Diagnosis not present

## 2018-02-14 DIAGNOSIS — I10 Essential (primary) hypertension: Secondary | ICD-10-CM | POA: Diagnosis not present
